# Patient Record
Sex: Male | Born: 1980 | Race: White | Hispanic: Yes | Marital: Single | Smoking: Never smoker
Health system: Southern US, Community
[De-identification: ages and names within clinical notes are randomized; demographics above are authoritative.]

---

## 2021-06-08 ENCOUNTER — Emergency Department (HOSPITAL_COMMUNITY): Payer: 59

## 2021-06-08 ENCOUNTER — Emergency Department (HOSPITAL_COMMUNITY)
Admission: EM | Admit: 2021-06-08 | Discharge: 2021-06-09 | Disposition: A | Discharge status: Home / Self Care | Payer: 59 | Attending: Emergency Medicine | Admitting: Emergency Medicine

## 2021-06-08 ENCOUNTER — Encounter (HOSPITAL_COMMUNITY): Payer: Self-pay | Admitting: Emergency Medicine

## 2021-06-08 ENCOUNTER — Other Ambulatory Visit: Payer: Self-pay

## 2021-06-08 DIAGNOSIS — M25471 Effusion, right ankle: Secondary | ICD-10-CM | POA: Insufficient documentation

## 2021-06-08 DIAGNOSIS — R0981 Nasal congestion: Secondary | ICD-10-CM | POA: Diagnosis not present

## 2021-06-08 DIAGNOSIS — M25571 Pain in right ankle and joints of right foot: Secondary | ICD-10-CM | POA: Diagnosis not present

## 2021-06-08 DIAGNOSIS — R059 Cough, unspecified: Secondary | ICD-10-CM | POA: Insufficient documentation

## 2021-06-08 DIAGNOSIS — Z20822 Contact with and (suspected) exposure to covid-19: Secondary | ICD-10-CM | POA: Diagnosis not present

## 2021-06-08 NOTE — ED Triage Notes (Signed)
Right ankle pain that he fractured a year ago and he got hurt today. ?

## 2021-06-08 NOTE — ED Provider Triage Note (Signed)
Emergency Medicine Provider Triage Evaluation Note ? ?Everette Rank , a 41 y.o. male  was evaluated in triage.  Pt complains of right foot/ankle pain after walking around all day. Hx of right ankle fracture previously, states he thinks he re-injured it today. Also reports concern he was exposed to something at work as he had had cough/congestion.  ? ?Review of Systems  ?Positive: Congestion, cough, arthralgia ?Negative: Fever, dyspnea, hemoptysis ? ?Physical Exam  ?BP 128/86   Pulse 81   Temp 97.9 ?F (36.6 ?C) (Oral)   Resp 16   SpO2 97%  ?Gen:   Awake, no distress   ?Resp:  Normal effort  ?MSK:   Right ankle: medial/lateral malleolar tenderness, and mid foot tenderness to palpation. 2+ Dp pulse.  ? ?Medical Decision Making  ?Medically screening exam initiated at 11:42 PM.  Appropriate orders placed.  Nikolaos Maddocks was informed that the remainder of the evaluation will be completed by another provider, this initial triage assessment does not replace that evaluation, and the importance of remaining in the ED until their evaluation is complete. ? ?Ankle pain ?Cough.  ?  ?Cherly Anderson, PA-C ?06/08/21 2344 ? ?

## 2021-06-09 LAB — RESP PANEL BY RT-PCR (FLU A&B, COVID) ARPGX2
Influenza A by PCR: NEGATIVE
Influenza B by PCR: NEGATIVE
SARS Coronavirus 2 by RT PCR: NEGATIVE

## 2021-06-09 MED ORDER — NAPROXEN 250 MG PO TABS
500.0000 mg | ORAL_TABLET | Freq: Once | ORAL | Status: AC
  Administered 2021-06-09: 500 mg via ORAL
  Filled 2021-06-09: qty 2

## 2021-06-09 MED ORDER — NAPROXEN 500 MG PO TABS: 500.0000 mg | ORAL_TABLET | Freq: Two times a day (BID) | ORAL | 0 refills | Status: AC | PRN

## 2021-06-09 NOTE — Discharge Instructions (Addendum)
Please read and follow all provided instructions. ? ?You have been seen today for right ankle pain as well as a cough ? ?Tests performed today include: ?An x-ray of the affected area - does NOT show any broken bones or dislocations.  ?Covid/flu testing: negative ?Chest xray: no pneumonia. ?Vital signs. See below for your results today.  ? ?Home care instructions: -- *PRICE in the first 24-48 hours after injury: ?Protect (with brace, splint, sling), if given by your provider ?Rest ?Ice- Do not apply ice pack directly to your skin, place towel or similar between your skin and ice/ice pack. Apply ice for 20 min, then remove for 40 min while awake ?Compression- Wear brace, elastic bandage, splint as directed by your provider ?Elevate affected extremity above the level of your heart when not walking around for the first 24-48 hours  ? ?Medications:  ?- Naproxen is a nonsteroidal anti-inflammatory medication that will help with pain and swelling. Be sure to take this medication as prescribed with food, 1 pill every 12 hours,  It should be taken with food, as it can cause stomach upset, and more seriously, stomach bleeding. Do not take other nonsteroidal anti-inflammatory medications with this such as Advil, Motrin, Aleve, Mobic, Goodie Powder, or Motrin.   ? ?You make take Tylenol per over the counter dosing with these medications.  ? ?We have prescribed you new medication(s) today. Discuss the medications prescribed today with your pharmacist as they can have adverse effects and interactions with your other medicines including over the counter and prescribed medications. Seek medical evaluation if you start to experience new or abnormal symptoms after taking one of these medicines, seek care immediately if you start to experience difficulty breathing, feeling of your throat closing, facial swelling, or rash as these could be indications of a more serious allergic reaction ? ?Follow-up instructions: ?Please follow-up with  your primary care provider or the provided orthopedic physician (bone specialist) if you continue to have significant pain in 1 week. In this case you may have a more severe injury that requires further care.  ? ?Return instructions:  ?Please return if your digits or extremity are numb or tingling, appear gray or blue, or you have severe pain (also elevate the extremity and loosen splint or wrap if you were given one) ?Please return if you have redness or fevers.  ?Please return to the Emergency Department if you experience worsening symptoms.  ?Please return if you have any other emergent concerns. ?Additional Information: ? ?Your vital signs today were: ?BP 113/79 (BP Location: Left Arm)   Pulse 81   Temp 97.9 ?F (36.6 ?C) (Oral)   Resp 18   SpO2 95%  ?If your blood pressure (BP) was elevated above 135/85 this visit, please have this repeated by your doctor within one month. ?--------------- ? ?

## 2021-06-09 NOTE — ED Provider Notes (Signed)
?[facility] EMERGENCY DEPARTMENT ?Provider Note ? ? ?CSN: [id number] ?Arrival date & time: 06/08/21  2159 ? ?  ? ?History ? ?Chief Complaint  ?Patient presents with  ? Ankle Pain  ? ? ?James Stafford is a 41 y.o. male without significant pmhx who presents to the ED with complaints of right ankle pain today. Patient reports hx of prior right ankle fx, states he did a lot of walking today and thinks this lead to re-injury with increase in pain. Worse with movement, no alleviating factors. Denies numbness,tingling, or weakness. Also reports some congestion/cough for a few days, co-workers have been sick. Denies fever, dyspnea, chest pain, or hemoptysis.  ? ?HPI ? ?  ? ?Home Medications ?Prior to Admission medications   ?Medication Sig Start Date End Date Taking? Authorizing Provider  ?naproxen (NAPROSYN) 500 MG tablet Take 1 tablet (500 mg total) by mouth 2 (two) times daily as needed for moderate pain. 06/09/21  Yes Brinkley Peet, Pleas Koch, PA-C  ?   ? ?Allergies    ?Patient has no known allergies.   ? ?Review of Systems   ?Review of Systems  ?Constitutional:  Negative for chills and fever.  ?HENT:  Positive for congestion.   ?Respiratory:  Positive for cough. Negative for shortness of breath.   ?Cardiovascular:  Negative for chest pain.  ?Gastrointestinal:  Negative for abdominal pain and vomiting.  ?Musculoskeletal:  Positive for arthralgias.  ?Neurological:  Negative for syncope.  ?All other systems reviewed and are negative. ? ?Physical Exam ?Updated Vital Signs ?BP 113/79 (BP Location: Left Arm)   Pulse 81   Temp 97.9 ?F (36.6 ?C) (Oral)   Resp 18   SpO2 95%  ?Physical Exam ?Vitals and nursing note reviewed.  ?Constitutional:   ?   General: He is not in acute distress. ?   Appearance: He is well-developed. He is not ill-appearing or toxic-appearing.  ?HENT:  ?   Head: Normocephalic and atraumatic.  ?   Right Ear: Ear canal normal. Tympanic membrane is not perforated, erythematous, retracted  or bulging.  ?   Left Ear: Ear canal normal. Tympanic membrane is not perforated, erythematous, retracted or bulging.  ?   Ears:  ?   Comments: No mastoid erythema/swellng/tenderness.  ?   Nose:  ?   Right Sinus: No maxillary sinus tenderness or frontal sinus tenderness.  ?   Left Sinus: No maxillary sinus tenderness or frontal sinus tenderness.  ?   Mouth/Throat:  ?   Pharynx: Oropharynx is clear. Uvula midline. No oropharyngeal exudate or posterior oropharyngeal erythema.  ?   Comments: Posterior oropharynx is symmetric appearing. Patient tolerating own secretions without difficulty. No trismus. No drooling. No hot potato voice. No swelling beneath the tongue, submandibular compartment is soft.  ?Eyes:  ?   General:     ?   Right eye: No discharge.     ?   Left eye: No discharge.  ?   Conjunctiva/sclera: Conjunctivae normal.  ?Cardiovascular:  ?   Rate and Rhythm: Normal rate and regular rhythm.  ?   Pulses:     ?     Dorsalis pedis pulses are 2+ on the right side and 2+ on the left side.  ?     Posterior tibial pulses are 2+ on the right side and 2+ on the left side.  ?Pulmonary:  ?   Effort: Pulmonary effort is normal. No respiratory distress.  ?   Breath sounds: Normal breath sounds. No wheezing, rhonchi or rales.  ?  Abdominal:  ?   General: There is no distension.  ?   Palpations: Abdomen is soft.  ?   Tenderness: There is no abdominal tenderness.  ?Musculoskeletal:  ?   Cervical back: Neck supple. No rigidity.  ?   Comments: Lower extremities: No obvious deformity, pitting edema, erythema, ecchymosis, warmth, or open wounds. Mild swelling to the right ankle. Patient has intact AROM to bilateral hips, knees, ankles, and all digits. Tender to palpation to the right medial/lateral malleolus and ankle ligaments as well as the midfoot. Otherwise nontender. Compartments are soft.    ?Lymphadenopathy:  ?   Cervical: No cervical adenopathy.  ?Skin: ?   General: Skin is warm and dry.  ?   Capillary Refill: Capillary  refill takes less than 2 seconds.  ?   Findings: No rash.  ?Neurological:  ?   Mental Status: He is alert.  ?   Comments: Alert. Clear speech. Sensation grossly intact to bilateral lower extremities. 5/5 strength with plantar/dorsiflexion bilaterally. Patient ambulatory.   ?Psychiatric:     ?   Mood and Affect: Mood normal.     ?   Behavior: Behavior normal.  ? ? ?ED Results / Procedures / Treatments   ?Labs ?(all labs ordered are listed, but only abnormal results are displayed) ?Labs Reviewed  ?RESP PANEL BY RT-PCR (FLU A&B, COVID) [id number]  ? ? ?EKG ?None ? ?Radiology ?DG Chest 2 View ? ?Result Date: 06/09/2021 ?CLINICAL DATA:  Cough. EXAM: CHEST - 2 VIEW COMPARISON:  None. FINDINGS: The heart size and mediastinal contours are within normal limits. Both lungs are clear. The visualized skeletal structures are unremarkable. IMPRESSION: No active cardiopulmonary disease. Electronically Signed   By: Elgie Collard M.D.   On: 06/09/2021 00:13  ? ?DG Ankle Complete Right ? ?Result Date: 06/09/2021 ?CLINICAL DATA:  Right ankle pain. EXAM: RIGHT ANKLE - COMPLETE 3+ VIEW COMPARISON:  None. FINDINGS: There is no evidence of an acute fracture, dislocation, or joint effusion. A chronic fracture deformity is seen involving the right lateral malleolus. Chronic changes are also seen along the lateral aspect of the distal right tibia and dorsal aspect of the distal right talus. Mild diffuse soft tissue swelling is seen. IMPRESSION: Mild diffuse soft tissue swelling without evidence of an acute osseous abnormality. Electronically Signed   By: Aram Candela M.D.   On: 06/09/2021 00:11  ? ?DG Foot Complete Right ? ?Result Date: 06/09/2021 ?CLINICAL DATA:  Right ankle pain. EXAM: RIGHT FOOT COMPLETE - 3+ VIEW COMPARISON:  None. FINDINGS: There is no evidence of an acute fracture or dislocation. Chronic deformities are seen involving the right lateral malleolus and distal right tibia. A thin, 4.5 mm curvilinear chronic deformity is  also seen along the dorsal aspect of the right talus. Mild soft tissue swelling is seen in the region of the right ankle. IMPRESSION: Mild soft tissue swelling without evidence of acute osseous abnormality. Electronically Signed   By: Aram Candela M.D.   On: 06/09/2021 00:13   ? ?Procedures ?Procedures  ? ? ?Medications Ordered in ED ?Medications  ?naproxen (NAPROSYN) tablet 500 mg (500 mg Oral Given 06/09/21 0508)  ? ? ?ED Course/ Medical Decision Making/ A&P ?  ?                        ?Medical Decision Making ?Amount and/or Complexity of Data Reviewed ?Radiology: ordered. ? ?Risk ?Prescription drug management. ? ? ?Patient presents to the ED with complaints of right  ankle pain, also mentions congestion/cough, this involves an extensive number of treatment options, and is a complaint that carries with it a high risk of complications and morbidity. Nontoxic, vitals without significant abnormality..  ? ?Additional history obtained:  ?Chart & nursing note reviewed.  ? ?Covid/flu test: Negative ? ?Imaging Studies ordered:  ?I ordered and viewed the following imaging, agree with radiologist impression:  ?CXR:  No active cardiopulmonary disease. ?Right ankle xray:  Mild diffuse soft tissue swelling without evidence of an acute osseous abnormality. ?Right foot xray: Mild soft tissue swelling without evidence of acute osseous abnormality ? ?Right ankle pain: Xrays without fx/dislocation. No signs of infection. NVI distally. No findings of ischemia. No edema or calf tenderness to suggest DVT. Will place in ASO. NSAIDs provided. Orthopedics follow up.  ? ?Cough/congestion: Exam is without signs of AOM, AOE, or mastoiditis. Oropharyngeal exam is benign. No sinus tenderness. No meningeal signs. Lungs are CTA without focal adventitious sounds, no signs of increased work of breathing, CXR without infiltrate, doubt CAP. Covid/flu negative. Suspect viral vs. Allergic. Discussed supportive care.  ? ?Patient overall appears  appropriate for discharge.  ?I discussed results, treatment plan, need for follow-up, and return precautions with the patient. Provided opportunity for questions, patient confirmed understanding and is in agreem

## 2021-07-21 ENCOUNTER — Emergency Department (HOSPITAL_COMMUNITY)
Admission: EM | Admit: 2021-07-21 | Discharge: 2021-07-22 | Disposition: A | Discharge status: Home / Self Care | Payer: 59 | Attending: Emergency Medicine | Admitting: Emergency Medicine

## 2021-07-21 ENCOUNTER — Encounter (HOSPITAL_COMMUNITY): Payer: Self-pay

## 2021-07-21 ENCOUNTER — Other Ambulatory Visit: Payer: Self-pay

## 2021-07-21 DIAGNOSIS — W07XXXA Fall from chair, initial encounter: Secondary | ICD-10-CM | POA: Insufficient documentation

## 2021-07-21 DIAGNOSIS — S2232XA Fracture of one rib, left side, initial encounter for closed fracture: Secondary | ICD-10-CM | POA: Diagnosis not present

## 2021-07-21 DIAGNOSIS — S299XXA Unspecified injury of thorax, initial encounter: Secondary | ICD-10-CM | POA: Diagnosis present

## 2021-07-21 NOTE — ED Triage Notes (Signed)
Pt reports that he is here today due to rib pain. Pt reports that pain is located on the left side. Pt reports he is having sob.  ?

## 2021-07-22 ENCOUNTER — Emergency Department (HOSPITAL_COMMUNITY): Payer: 59

## 2021-07-22 LAB — CBC WITH DIFFERENTIAL/PLATELET
Abs Immature Granulocytes: 0.05 10*3/uL (ref 0.00–0.07)
Basophils Absolute: 0 10*3/uL (ref 0.0–0.1)
Basophils Relative: 0 %
Eosinophils Absolute: 0.1 10*3/uL (ref 0.0–0.5)
Eosinophils Relative: 1 %
HCT: 46.4 % (ref 39.0–52.0)
Hemoglobin: 15.9 g/dL (ref 13.0–17.0)
Immature Granulocytes: 1 %
Lymphocytes Relative: 32 %
Lymphs Abs: 3 10*3/uL (ref 0.7–4.0)
MCH: 31.4 pg (ref 26.0–34.0)
MCHC: 34.3 g/dL (ref 30.0–36.0)
MCV: 91.5 fL (ref 80.0–100.0)
Monocytes Absolute: 0.6 10*3/uL (ref 0.1–1.0)
Monocytes Relative: 6 %
Neutro Abs: 5.6 10*3/uL (ref 1.7–7.7)
Neutrophils Relative %: 60 %
Platelets: 224 10*3/uL (ref 150–400)
RBC: 5.07 MIL/uL (ref 4.22–5.81)
RDW: 11.9 % (ref 11.5–15.5)
WBC: 9.4 10*3/uL (ref 4.0–10.5)
nRBC: 0 % (ref 0.0–0.2)

## 2021-07-22 LAB — TROPONIN I (HIGH SENSITIVITY)
Troponin I (High Sensitivity): <2 ng/L (ref <18)
Troponin I (High Sensitivity): 2 ng/L (ref <18)

## 2021-07-22 LAB — COMPREHENSIVE METABOLIC PANEL
ALT: 27 U/L (ref 0–44)
AST: 29 U/L (ref 15–41)
Albumin: 4.2 g/dL (ref 3.5–5.0)
Alkaline Phosphatase: 62 U/L (ref 38–126)
Anion gap: 13 (ref 5–15)
BUN: 18 mg/dL (ref 6–20)
CO2: 20 mmol/L — ABNORMAL LOW (ref 22–32)
Calcium: 9 mg/dL (ref 8.9–10.3)
Chloride: 105 mmol/L (ref 98–111)
Creatinine, Ser: 0.9 mg/dL (ref 0.61–1.24)
GFR, Estimated: >60 mL/min (ref >60)
Glucose, Bld: 86 mg/dL (ref 70–99)
Potassium: 3.9 mmol/L (ref 3.5–5.1)
Sodium: 138 mmol/L (ref 135–145)
Total Bilirubin: 1 mg/dL (ref 0.3–1.2)
Total Protein: 7.9 g/dL (ref 6.5–8.1)

## 2021-07-22 MED ORDER — IBUPROFEN 800 MG PO TABS: 800.0000 mg | ORAL_TABLET | Freq: Three times a day (TID) | ORAL | 0 refills | Status: AC

## 2021-07-22 MED ORDER — HYDROCODONE-ACETAMINOPHEN 5-325 MG PO TABS: 1.0000 | ORAL_TABLET | ORAL | 0 refills | Status: AC | PRN

## 2021-07-22 MED ORDER — HYDROCODONE-ACETAMINOPHEN 5-325 MG PO TABS
1.0000 | ORAL_TABLET | Freq: Once | ORAL | Status: AC
  Administered 2021-07-22: 1 via ORAL
  Filled 2021-07-22: qty 1

## 2021-07-22 NOTE — ED Provider Notes (Signed)
?[name] ?Provider Note ? ? ?CSN: [id number] ?Arrival date & time: 07/21/21  2228 ? ?  ? ?History ? ?Chief Complaint  ?Patient presents with  ? Rib Injury  ? ? ?James Stafford is a 41 y.o. male. ? ?HPI ?41 year old male with no significant medical history presents to the ER with complaints of left-sided rib pain.  Patient states that he was sitting in a chair when she fell out of about 3 days ago and landed on a rock.  He thinks his left rib might be fractured.  He had endorses some pain on inspiration.  Has not taken anything for his pain.  He reports left-sided chest pain to palpation. ?  ? ?Home Medications ?Prior to Admission medications   ?Medication Sig Start Date End Date Taking? Authorizing Provider  ?HYDROcodone-acetaminophen (NORCO/VICODIN) 5-325 MG tablet Take 1 tablet by mouth every 4 (four) hours as needed for up to 3 days. 07/22/21 07/25/21 Yes Mare FerrariBelaya, Konrad Hoak A, PA-C  ?ibuprofen (ADVIL) 800 MG tablet Take 1 tablet (800 mg total) by mouth 3 (three) times daily. 07/22/21  Yes Mare FerrariBelaya, Leslie Jester A, PA-C  ?naproxen (NAPROSYN) 500 MG tablet Take 1 tablet (500 mg total) by mouth 2 (two) times daily as needed for moderate pain. 06/09/21   Petrucelli, Pleas KochSamantha R, PA-C  ?   ? ?Allergies    ?Patient has no known allergies.   ? ?Review of Systems   ?Review of Systems ?Ten systems reviewed and are negative for acute change, except as noted in the HPI.  ? ?Physical Exam ?Updated Vital Signs ?BP 116/81   Pulse 92   Temp 97.7 ?F (36.5 ?C) (Oral)   Resp 18   SpO2 94%  ?Physical Exam ?Vitals and nursing note reviewed.  ?Constitutional:   ?   General: He is not in acute distress. ?   Appearance: He is well-developed.  ?HENT:  ?   Head: Normocephalic and atraumatic.  ?Eyes:  ?   Conjunctiva/sclera: Conjunctivae normal.  ?Cardiovascular:  ?   Rate and Rhythm: Normal rate and regular rhythm.  ?   Heart sounds: No murmur heard. ?Pulmonary:  ?   Effort: Pulmonary effort is normal. No  respiratory distress.  ?   Breath sounds: Normal breath sounds.  ?Chest:  ? ? ?   Comments: Tenderness to palpation over the left mid rib cage.  No evidence of deformities, no evidence of flail chest, no bruising ?Abdominal:  ?   Palpations: Abdomen is soft.  ?   Tenderness: There is no abdominal tenderness.  ?Musculoskeletal:     ?   General: No swelling.  ?   Cervical back: Neck supple.  ?Skin: ?   General: Skin is warm and dry.  ?   Capillary Refill: Capillary refill takes less than 2 seconds.  ?Neurological:  ?   Mental Status: He is alert.  ?Psychiatric:     ?   Mood and Affect: Mood normal.  ? ? ?ED Results / Procedures / Treatments   ?Labs ?(all labs ordered are listed, but only abnormal results are displayed) ?Labs Reviewed  ?COMPREHENSIVE METABOLIC PANEL - Abnormal; Notable for the following components:  ?    Result Value  ? CO2 20 (*)   ? All other components within normal limits  ?CBC WITH DIFFERENTIAL/PLATELET  ?TROPONIN I (HIGH SENSITIVITY)  ?TROPONIN I (HIGH SENSITIVITY)  ? ? ?EKG ?None ? ?Radiology ?DG Chest 2 View ? ?Addendum Date: 07/22/2021   ?ADDENDUM REPORT: 07/22/2021 01:15 ADDENDUM: Critical  findings were reported to PA [facility] at 1:15 a.m. Electronically Signed   By: Thornell Sartorius M.D.   On: 07/22/2021 01:15  ? ?Result Date: 07/22/2021 ?CLINICAL DATA:  Left-sided chest pain, possible rib fracture. EXAM: CHEST - 2 VIEW COMPARISON:  08/08/2021. FINDINGS: The heart size and mediastinal contours are within normal limits. Subsegmental atelectasis is noted at the left lung base. No effusion is identified. There is a questionable trace pneumothorax in the lateral aspect of the lower left lung. No obvious fracture is identified. IMPRESSION: Subsegmental atelectasis at the left lung base with questionable trace pneumothorax along the lateral aspect of the left lung. No obvious acute fracture is seen. CT may be beneficial for further evaluation. Electronically Signed: By: Thornell Sartorius M.D. On:  07/22/2021 00:55  ? ?CT Chest Wo Contrast ? ?Result Date: 07/22/2021 ?CLINICAL DATA:  Possible pneumothorax on left seen on earlier radiograph. EXAM: CT CHEST WITHOUT CONTRAST TECHNIQUE: Multidetector CT imaging of the chest was performed following the standard protocol without IV contrast. RADIATION DOSE REDUCTION: This exam was performed according to the departmental dose-optimization program which includes automated exposure control, adjustment of the mA and/or kV according to patient size and/or use of iterative reconstruction technique. COMPARISON:  Chest radiograph dated 07/22/2021. FINDINGS: Evaluation of this exam is limited in the absence of intravenous contrast. Cardiovascular: There is no cardiomegaly or pericardial effusion. The thoracic aorta and central pulmonary arteries are grossly unremarkable on this noncontrast CT. Mediastinum/Nodes: No hilar or mediastinal adenopathy. The esophagus and the thyroid gland are grossly unremarkable. No mediastinal fluid collection. Lungs/Pleura: No focal consolidation, pleural effusion, or pneumothorax. The central airways are patent. Upper Abdomen: No acute abnormality. Musculoskeletal: Minimally displaced fracture of the anterolateral left sixth rib. Old healed right posterior tenth rib fracture. IMPRESSION: Minimally displaced fracture of the anterolateral left sixth rib. No pneumothorax. Electronically Signed   By: Elgie Collard M.D.   On: 07/22/2021 02:15   ? ?Procedures ?Procedures  ? ? ?Medications Ordered in ED ?Medications  ?HYDROcodone-acetaminophen (NORCO/VICODIN) 5-325 MG per tablet 1 tablet (1 tablet Oral Given 07/22/21 0338)  ? ? ?ED Course/ Medical Decision Making/ A&P ?  ?                        ?Medical Decision Making ?Risk ?Prescription drug management. ? ? ?41 year old male presenting with left-sided rib pain with concerns for possible rib fracture.  On arrival, his vitals overall reassuring, he is not tachycardic, tachypneic or hypoxic.  His  physical exam is positive for some tenderness over the left rib cage but no evidence of flail chest.  He had an x-ray done in triage which showed a possible questionable left-sided small pneumothorax no obvious fracture seen.  CT of the chest was ordered in triage, reviewed and interpreted by me, agree with radiology read.  Evidence of left minimally displaced fracture of the sixth anterolateral rib.  No evidence of pneumothorax.  I personally reviewed his other lab work ordered in triage.  CBC without leukocytosis, CMP without any significant electrolyte abnormalities,  delta troponins negative. EKG is nonischemic.  Patient was given an incentive spirometer and given Norco for pain.  Will prescribe short course of Norco and encouraged ibuprofen for pain.  Patient does not have a PCP but does state that he had Medicaid.  He will schedule an appointment with 1.  We discussed return precautions.  He voiced understanding and is agreeable.  Stable for discharge. ?Final Clinical Impression(s) /  ED Diagnoses ?Final diagnoses:  ?Closed fracture of one rib of left side, initial encounter  ? ? ?Rx / DC Orders ?ED Discharge Orders   ? ?      Ordered  ?  HYDROcodone-acetaminophen (NORCO/VICODIN) 5-325 MG tablet  Every 4 hours PRN       ? 07/22/21 0301  ?  ibuprofen (ADVIL) 800 MG tablet  3 times daily       ? 07/22/21 0301  ? ?  ?  ? ?  ? ? ?  ?Mare Ferrari, PA-C ?07/22/21 0354 ? ?  ?Shon Baton, MD ?07/22/21 [phone] ? ?

## 2021-07-22 NOTE — ED Provider Triage Note (Signed)
Emergency Medicine Provider Triage Evaluation Note ? ?Everette Rank , a 41 y.o. male  was evaluated in triage.  Pt complains of left rib pain after falling out of a chair and possibly hitting it on the rock 3 days ago. He reports some pain with deep breaths. Reports some chest pain.  ? ?Review of Systems  ?Positive:  ?Negative:  ? ?Physical Exam  ?BP 117/77 (BP Location: Right Arm)   Pulse 100   Temp 97.7 ?F (36.5 ?C) (Oral)   Resp 17   SpO2 100%  ?Gen:   Awake, no distress   ?Resp:  Normal effort  ?MSK:   Moves extremities without difficulty  ?Other:  No obvious trauma to the chest. No overlying skin changes. No step offs or deformities. Tender to the lower lateral left chest. No flail chest.  ? ?Medical Decision Making  ?Medically screening exam initiated at 12:23 AM.  Appropriate orders placed.  Brace Welte was informed that the remainder of the evaluation will be completed by another provider, this initial triage assessment does not replace that evaluation, and the importance of remaining in the ED until their evaluation is complete. ? ?Will order basic labs, EKG, and CXR.  ?  ?Achille Rich, PA-C ?07/22/21 0025 ? ?

## 2021-07-22 NOTE — Discharge Instructions (Signed)
You were evaluated in the Emergency Department and after careful evaluation, we did not find any emergent condition requiring admission or further testing in the hospital. ?Your work-up today was positive for a fracture of the left sixth rib.  Although live rib fractures can be quite painful, they do tend to heal on their own.  Please use the provided incentive spirometer as directed.  You may take 800 mg of ibuprofen up to 3 times daily, and use Norco for breakthrough pain.  Please call the phone number in your discharge paperwork to schedule an appointment with a primary care doctor. ? ? ?Please return to the Emergency Department if you experience any worsening of your condition.   Thank you for allowing [place] to be a part of your care. ? ?

## 2021-07-22 NOTE — ED Notes (Signed)
Pt given and educated on proper use of incentive spirometer. ?

## 2023-07-01 IMAGING — CR DG CHEST 2V
2 series · 2 of 2 positions shown · non-contrast
Comparison: 08/08/2021.
COMPARISON: 08/08/2021.

Addendum:
CLINICAL DATA: Left-sided chest pain, possible rib fracture.

EXAM:
CHEST - 2 VIEW

[chest pa]
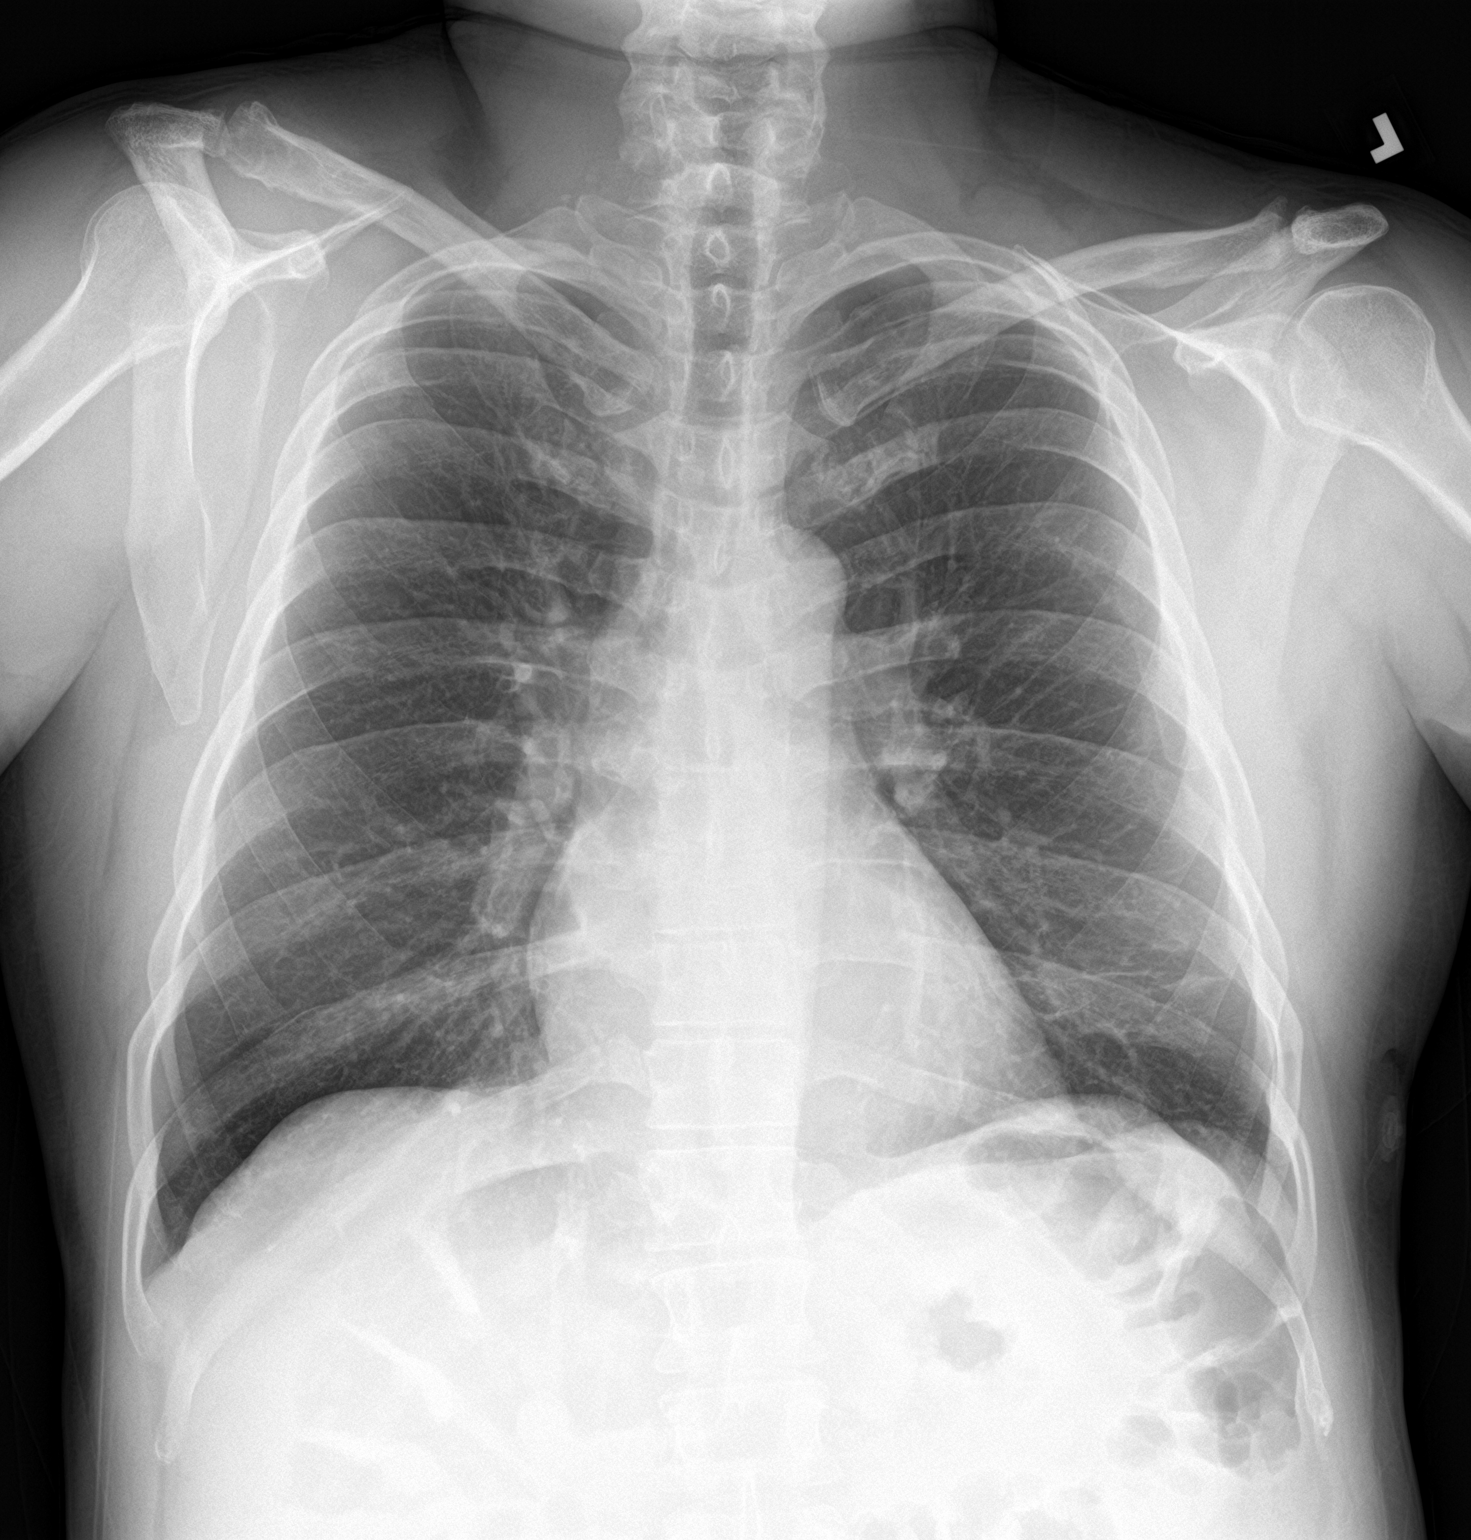

[chest lat]
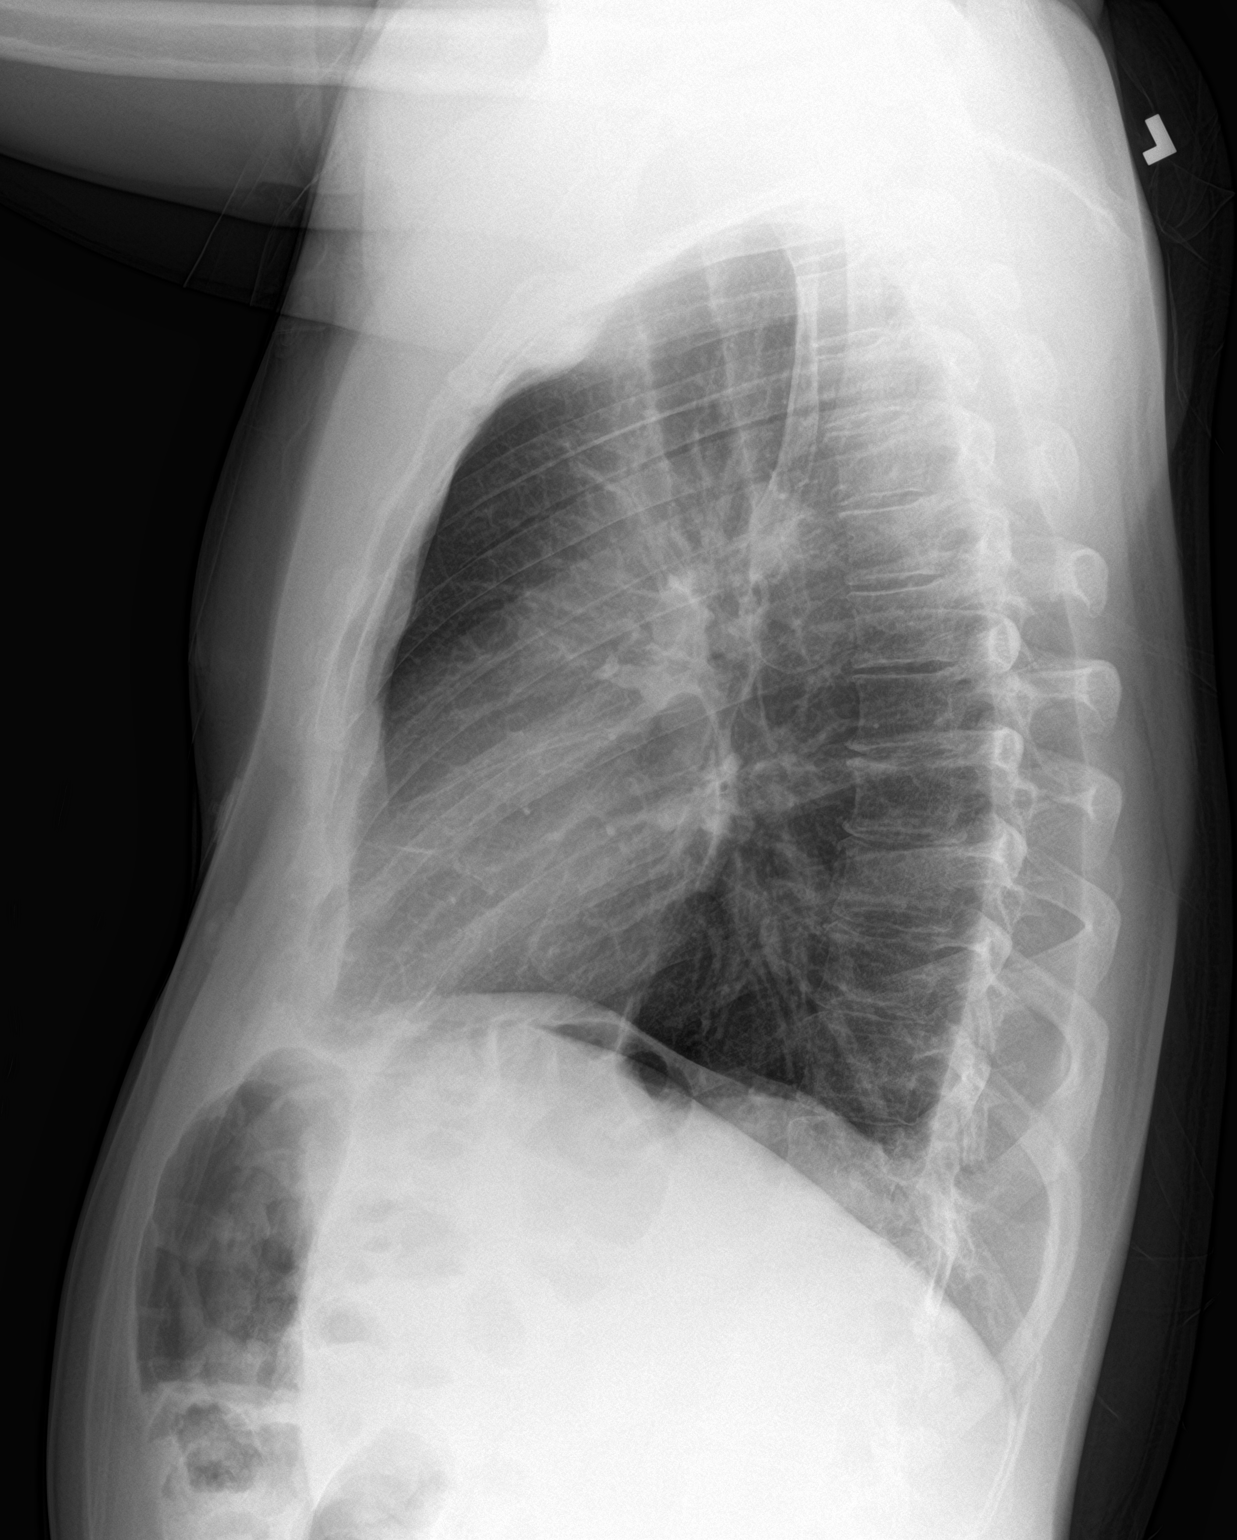

[2 of 2 positions shown; findings below may reference images not displayed]

FINDINGS: The heart size and mediastinal contours are within normal limits.
Subsegmental atelectasis is noted at the left lung base. No effusion
is identified. There is a questionable trace pneumothorax in the
lateral aspect of the lower left lung. No obvious fracture is
identified.
IMPRESSION: Subsegmental atelectasis at the left lung base with questionable
trace pneumothorax along the lateral aspect of the left lung. No
obvious acute fracture is seen. CT may be beneficial for further
evaluation.

ADDENDUM:
Critical findings were reported to PA Vargas Michel at [DATE] a.m.

*** End of Addendum ***
FINDINGS: The heart size and mediastinal contours are within normal limits.
Subsegmental atelectasis is noted at the left lung base. No effusion
is identified. There is a questionable trace pneumothorax in the
lateral aspect of the lower left lung. No obvious fracture is
identified.
IMPRESSION: Subsegmental atelectasis at the left lung base with questionable
trace pneumothorax along the lateral aspect of the left lung. No
obvious acute fracture is seen. CT may be beneficial for further
evaluation.
